# Patient Record
Sex: Female | Born: 1985 | Race: White | Hispanic: No | Marital: Married | Smoking: Never smoker
Health system: Southern US, Community
[De-identification: ages and names within clinical notes are randomized; demographics above are authoritative.]

## PROBLEM LIST (undated history)

## (undated) DIAGNOSIS — E0590 Thyrotoxicosis, unspecified without thyrotoxic crisis or storm: Secondary | ICD-10-CM

## (undated) DIAGNOSIS — R7989 Other specified abnormal findings of blood chemistry: Secondary | ICD-10-CM

## (undated) DIAGNOSIS — R0789 Other chest pain: Secondary | ICD-10-CM

## (undated) DIAGNOSIS — E785 Hyperlipidemia, unspecified: Secondary | ICD-10-CM

## (undated) HISTORY — DX: Other chest pain: R07.89

## (undated) HISTORY — DX: Thyrotoxicosis, unspecified without thyrotoxic crisis or storm: E05.90

## (undated) HISTORY — DX: Hyperlipidemia, unspecified: E78.5

## (undated) HISTORY — DX: Other specified abnormal findings of blood chemistry: R79.89

---

## 2006-02-14 ENCOUNTER — Ambulatory Visit: Payer: Self-pay | Admitting: Cardiology

## 2006-02-25 ENCOUNTER — Ambulatory Visit: Payer: Self-pay | Admitting: Endocrinology

## 2006-03-11 ENCOUNTER — Encounter: Payer: Self-pay | Admitting: Endocrinology

## 2006-03-25 ENCOUNTER — Encounter: Payer: Self-pay | Admitting: Endocrinology

## 2006-04-25 ENCOUNTER — Encounter: Payer: Self-pay | Admitting: Endocrinology

## 2006-05-02 ENCOUNTER — Encounter: Payer: Self-pay | Admitting: Endocrinology

## 2006-06-03 ENCOUNTER — Encounter: Payer: Self-pay | Admitting: Endocrinology

## 2006-06-30 ENCOUNTER — Encounter: Payer: Self-pay | Admitting: Endocrinology

## 2006-08-11 ENCOUNTER — Encounter: Payer: Self-pay | Admitting: Endocrinology

## 2006-09-12 ENCOUNTER — Encounter: Payer: Self-pay | Admitting: Endocrinology

## 2006-11-02 ENCOUNTER — Encounter: Payer: Self-pay | Admitting: Endocrinology

## 2007-02-07 ENCOUNTER — Encounter: Payer: Self-pay | Admitting: Endocrinology

## 2007-04-22 ENCOUNTER — Encounter: Payer: Self-pay | Admitting: *Deleted

## 2007-04-22 DIAGNOSIS — E0590 Thyrotoxicosis, unspecified without thyrotoxic crisis or storm: Secondary | ICD-10-CM | POA: Insufficient documentation

## 2007-05-02 ENCOUNTER — Encounter: Payer: Self-pay | Admitting: Endocrinology

## 2007-09-11 ENCOUNTER — Encounter: Payer: Self-pay | Admitting: Endocrinology

## 2007-11-02 ENCOUNTER — Encounter: Payer: Self-pay | Admitting: Endocrinology

## 2007-12-12 ENCOUNTER — Encounter: Payer: Self-pay | Admitting: Endocrinology

## 2009-01-15 ENCOUNTER — Encounter: Payer: Self-pay | Admitting: Endocrinology

## 2009-12-24 ENCOUNTER — Encounter: Payer: Self-pay | Admitting: Endocrinology

## 2010-01-09 ENCOUNTER — Ambulatory Visit: Payer: Self-pay | Admitting: Endocrinology

## 2010-08-18 NOTE — Letter (Signed)
Summary: Consent form/[facility]  Consent form/[facility]   Imported By: Sherian Rein 01/15/2010 09:48:22  _____________________________________________________________________  External Attachment:    Type:   Image     Comment:   External Document

## 2010-08-18 NOTE — Assessment & Plan Note (Signed)
Summary: NEW ENDO/THYROID/[facility]-PCP/BC[facility]/#./CD   Vital Signs:  Patient profile:   25 year old female Height:      59.5 inches (151.13 cm) Weight:      114.38 pounds (51.99 kg) BMI:     22.80 O2 Sat:      96 % on Room air Temp:     98.0 degrees F (36.67 degrees C) oral Pulse rate:   85 / minute BP sitting:   104 / 68  (left arm) Cuff size:   regular  Vitals Entered By: Brenton Grills MA (January 09, 2010 2:40 PM)  O2 Flow:  Room air CC: new endo pt/hyperthyroidism/pt states she is no longer taking propylthiouraacil/aj   Referring Provider:  howard  CC:  new endo pt/hyperthyroidism/pt states she is no longer taking propylthiouraacil/aj.  History of Present Illness: pt was seen here in 2007, after hyperthyroidism was dx'ed during a pregnancy.  she was rx'ed with ptu, and the pregnancy was uneventful.  she  moved to [place].  her hyperthyroidism improved, and she went off the ptu a few months postpartum.  she has been off the ptu since then, and tft have been normal.   pt has no sxs now.  Current Medications (verified): 1)  Propylthiouracil 50 Mg  Tabs (Propylthiouracil) .... Take 2 By Mouth Two Times A Day Qd  Allergies (verified): No Known Drug Allergies  Past History:  Past Medical History: Last updated: 04/22/2007 Hyperthyroidism  Family History: Reviewed history and no changes required. no thyroid dz  Social History: Reviewed history and no changes required. married does not work outside the home. 1 child (2008).  Review of Systems       The patient complains of headaches.  The patient denies weight loss and weight gain.         denies hoarseness, double vision, palpitations, sob, diarrhea, polyuria, myalgias, excessive diaphoresis, numbness, tremor, anxiety, bruising, and rhinorrhea.  she has regular menses.   Physical Exam  General:  normal appearance.   Head:  head: no deformity eyes: no periorbital swelling, no proptosis external nose and ears are  normal mouth: no lesion seen Neck:  thyroid is slightly and diffusely enlarged Lungs:  Clear to auscultation bilaterally. Normal respiratory effort.  Heart:  Regular rate and rhythm without murmurs or gallops noted. Normal S1,S2.   Msk:  muscle bulk and strength are grossly normal.  no obvious joint swelling.  gait is normal and steady  Extremities:  no deformity no edema Neurologic:  cn 2-12 grossly intact.   readily moves all 4's.   sensation is intact to touch on the feet  Skin:  normal texture and temp.  no rash.  not diaphoretic  Cervical Nodes:  No significant adenopathy.  Psych:  Alert and cooperative; normal mood and affect; normal attention span and concentration.   Additional Exam:  outside test results are reviewed:  12/24/09: t4=13.2 fti=3.2 tsh=1.8   Impression & Recommendations:  Problem # 1:  HYPERTHYROIDISM (ICD-242.90) resolved.  however, this pt is at high risk for recurrence of her hyperthyroidism, or for hypothyroidism  Problem # 2:  headache mild.  not related to #1  Medications Added to Medication List This Visit: 1)  Oral Contraceptive  2)  Sprintec 28 0.25-35 Mg-mcg Tabs (Norgestimate-eth estradiol) .... As dir  Other Orders: Consultation Level III [phone])  Patient Instructions: 1)  you should have your thyroid blood test checked at least once a year, or if a pregnancy should happen.  please return here if your thyroid  blood test becomes abnormal. 2)  with time, your thyroid will probably become high or low.

## 2010-08-18 NOTE — Letter (Signed)
Summary: Consult/Randall Riche MD  Consult/Randall Riche MD   Imported By: Sherian Rein 01/15/2010 09:52:05  _____________________________________________________________________  External Attachment:    Type:   Image     Comment:   External Document

## 2010-08-31 ENCOUNTER — Other Ambulatory Visit: Payer: Self-pay

## 2010-08-31 ENCOUNTER — Telehealth: Payer: Self-pay | Admitting: Endocrinology

## 2010-09-01 ENCOUNTER — Ambulatory Visit: Payer: Self-pay | Admitting: Endocrinology

## 2010-09-09 NOTE — Progress Notes (Signed)
Summary: LABS  Phone Note Call from Patient   Caller: Patient Call For: Minus Breeding MD Summary of Call: Per pt :  She is having thyroid problem  and she has appt tomorrow morning w/Dr Everardo All.  Pt want to know if she need lab work done before tomorrow so the results will be in.  Pt ph#---[phone]) Initial call taken by: Ivar Bury,  August 31, 2010 9:21 AM  Follow-up for Phone Call        tsh 242.9 Follow-up by: Minus Breeding MD,  August 31, 2010 10:41 AM  Additional Follow-up for Phone Call Additional follow up Details #1::        lab order placed into Epic.  Left detailed message for pt that she could come in today for thyroid blood test and to callback office with any questions/concerns Additional Follow-up by: Brenton Grills CMA Duncan Dull),  August 31, 2010 11:32 AM

## 2010-12-04 NOTE — Consult Note (Signed)
[facility]                            ENDOCRINOLOGY CONSULTATION   Erica Soto, Erica Soto             MRN:          [id number]  DATE:02/25/2006                            DOB:          [birth year]    REFERRING PHYSICIAN:  Dr. Dimas Aguas.   REASON FOR REFERRAL:  Hyperthyroidism.   HISTORY OF PRESENT ILLNESS:  This is a 25 year old woman who is now [redacted] weeks  pregnant. She was seen recently by Dr. Dimas Aguas complaining of several weeks  of palpitations and slight associated hair loss on the head.   PAST MEDICAL HISTORY:  Otherwise healthy.  She takes a prenatal vitamin.  Her husband states that her thyroid function studies were normal several  years ago.   SOCIAL HISTORY:  She is married and comes in today with her husband.  Significantly, they are moving to [place] on February 26, 2006, and they will  both be working there.   FAMILY HISTORY:  Negative for thyroid disease.   REVIEW OF SYSTEMS:  Denies the following:  Change in her weight, fever,  excessive diaphoresis, syncope, shortness of breath, nausea, vomiting,  anxiety, and depression.   PHYSICAL EXAMINATION:  VITALS:  Blood pressure 130/81.  Heart rate 103.  Temperature 98.9.  Weight 140.  GENERAL:  No distress.  SKIN:  Minimally diaphoretic.  HEENT:  No ptosis.  No periorbital swelling.  NECK:  They thyroid is 2 times the normal size in the left lobe, and about 1-  1/2 times normal size on the right.  CHEST:  Clear to auscultation.  No respiratory distress.  CARDIOVASCULAR:  No JVD.  No edema.  Tachycardic, regular rhythm, no murmur.  MUSCULOSKELETAL:  Gait is observed in the office to be normal.  Muscle bulk  and strength are normal.  NEUROLOGIC:  Alert, well-oriented, does not appear anxious nor depressed.  There is a minimal postural tremor present.   LABORATORY STUDIES:  Forwarded by Dr. Dimas Aguas.  On February 11, 2006, TSH 0.025.  Free thyroxine index normal at 3.7.   IMPRESSION:  1. Hyperthyroidism of uncertain etiology.  Grave's disease is most common.  2. I agree with Dr. Dimas Aguas that her palpitations and tachycardia are in      all likelihood due to the hyperthyroidism.  3. Nine weeks pregnancy.   PLAN:  1. We had a discussion about the natural history of Grave's disease and I      have told her I am not sure that that is in fact her diagnosis, but I      told her this is most likely.  I have given her a brochure about      hyperthyroidism and we have talked about the natural history of Grave's      disease.  2. I have given her a prescription for PTU 200 mg twice a day.  I have      told her of a rare side effect of PTU and she must discontinue this and      seek medical attention immediately if she develops fever on this.  3. I told her she must see a new physician, preferably a specialist, in  three weeks.  She will need to start arranging this as soon as she      arrives in [place].  I made a copy of the laboratory results from Dr.      Dimas Aguas and gave this to the patient.  In general, I do not duplicate      records from other physician's offices for patients while they are      here, but this is a bit of an unusual situation in that she is moving      to [place], and I think it is important that the new physician in      [place] have a copy of her labs.  4. I have told her if she is back in [place], I am happy to see her      then.                                   Sean A. Everardo All, MD   SAE/MedQ  DD:  02/27/2006  DT:  02/27/2006  Job #:  [id number]   cc:   [name]

## 2013-04-24 ENCOUNTER — Other Ambulatory Visit: Payer: Self-pay

## 2013-06-13 ENCOUNTER — Encounter (HOSPITAL_COMMUNITY): Payer: Self-pay | Admitting: Unknown Physician Specialty

## 2013-07-06 ENCOUNTER — Other Ambulatory Visit (HOSPITAL_COMMUNITY): Payer: Self-pay | Admitting: Unknown Physician Specialty

## 2013-07-06 DIAGNOSIS — O352XX1 Maternal care for (suspected) hereditary disease in fetus, fetus 1: Secondary | ICD-10-CM

## 2013-07-10 ENCOUNTER — Ambulatory Visit (HOSPITAL_COMMUNITY)
Admission: RE | Admit: 2013-07-10 | Discharge: 2013-07-10 | Disposition: A | Discharge status: Home / Self Care | Payer: BC Managed Care – PPO | Source: Ambulatory Visit | Attending: Unknown Physician Specialty | Admitting: Unknown Physician Specialty

## 2013-07-10 ENCOUNTER — Encounter (HOSPITAL_COMMUNITY): Payer: Self-pay

## 2013-07-10 DIAGNOSIS — IMO0002 Reserved for concepts with insufficient information to code with codable children: Secondary | ICD-10-CM | POA: Insufficient documentation

## 2013-07-10 DIAGNOSIS — O352XX1 Maternal care for (suspected) hereditary disease in fetus, fetus 1: Secondary | ICD-10-CM

## 2013-07-10 IMAGING — US US OB DETAIL+14 WK
1 series · 12 of 28 positions shown · non-contrast
Comparison: none

[Series 1: us ob detail+14 wk · 0.21mm/px · 12 of 77 slices shown]
[im 3/77]
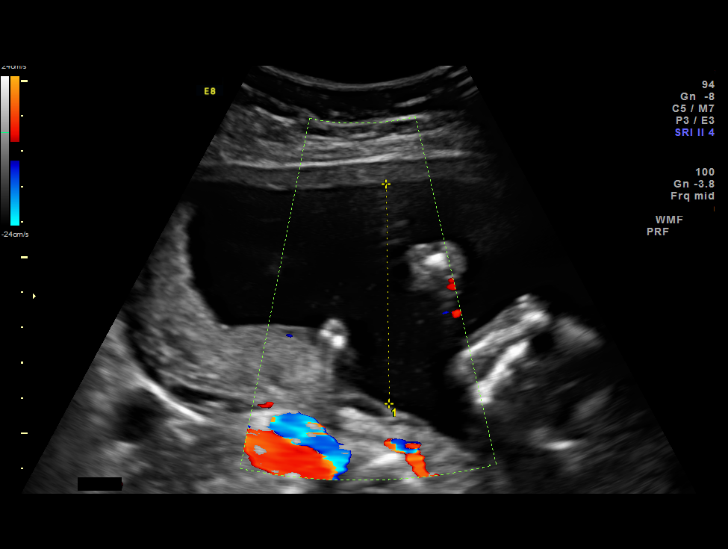
[im 9/77]
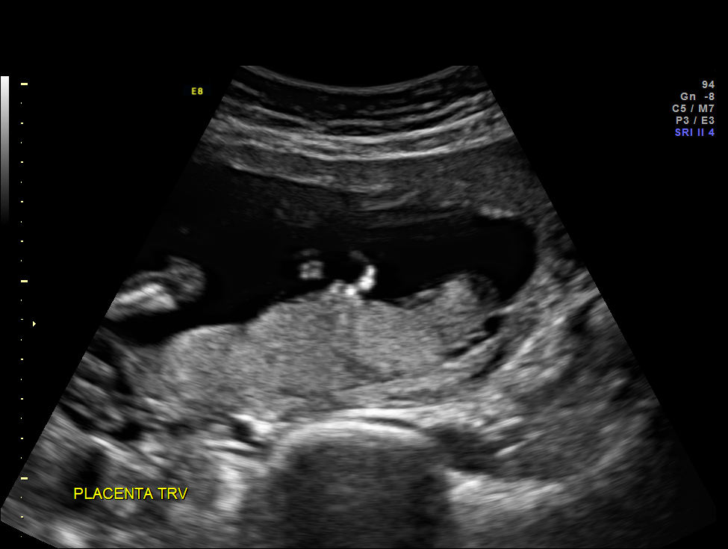
[im 15/77]
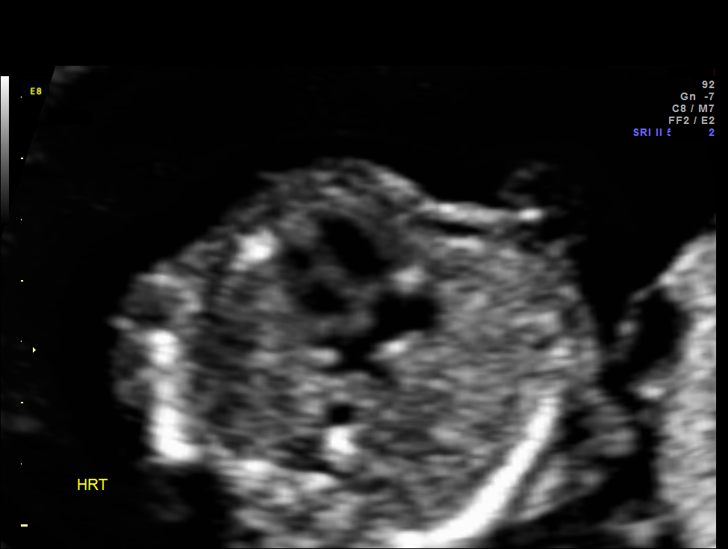
[im 23/77]
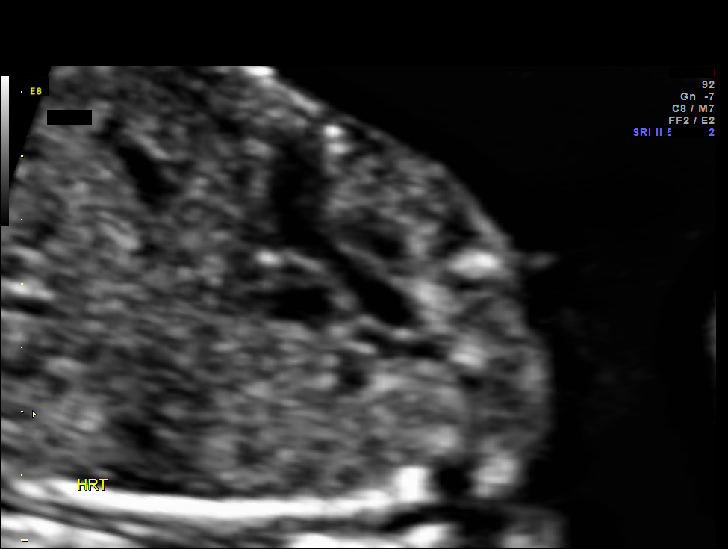
[im 29/77]
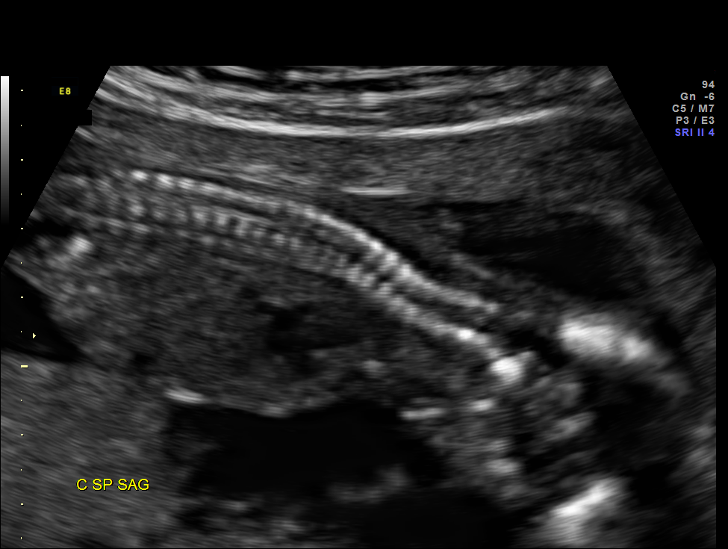
[im 34/77]
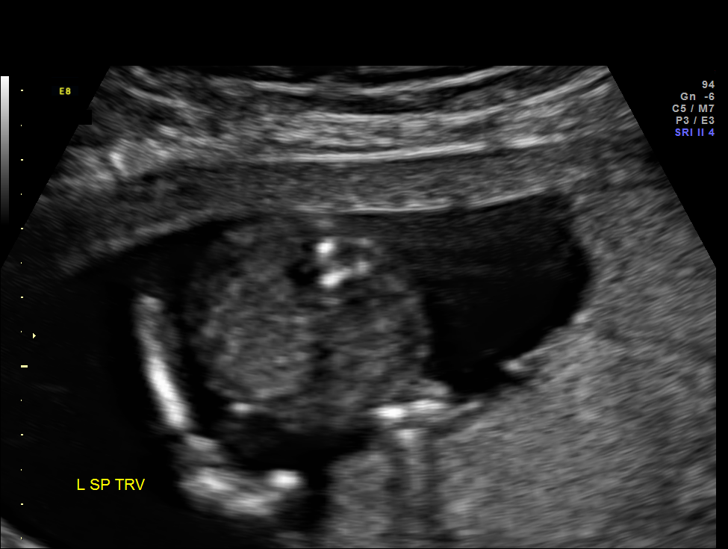
[im 43/77]
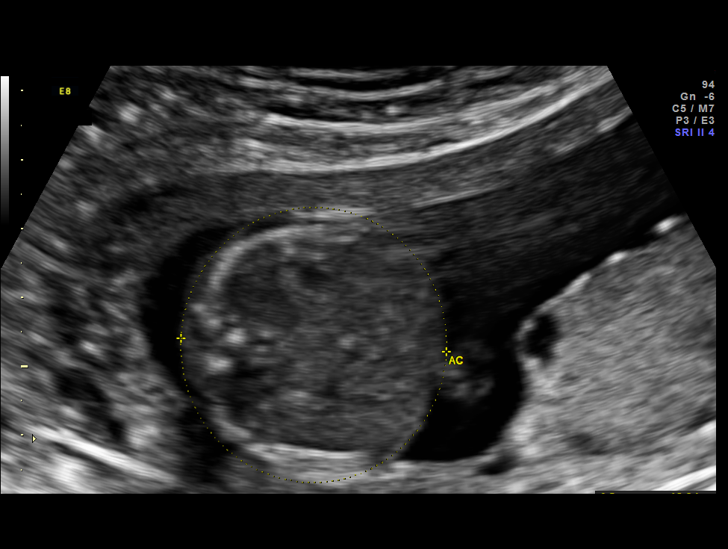
[im 48/77]
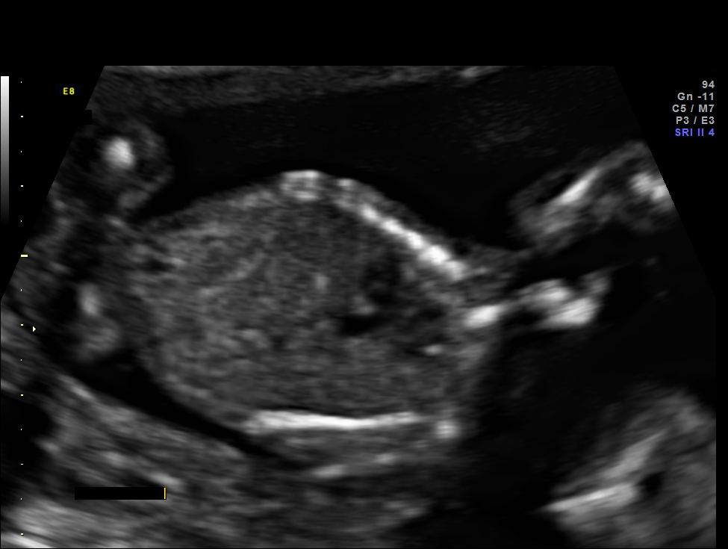
[im 54/77]
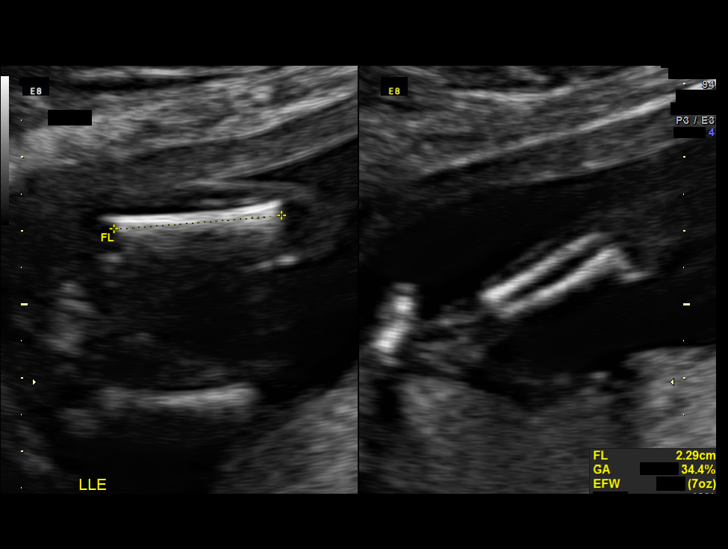
[im 62/77]
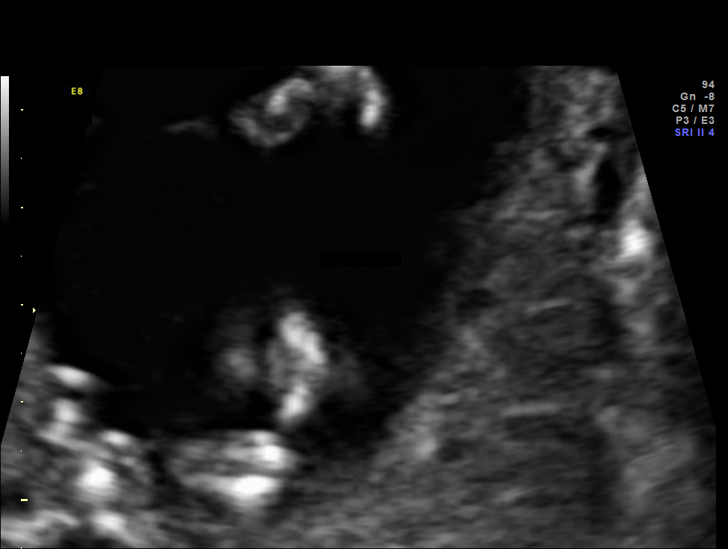
[im 68/77]
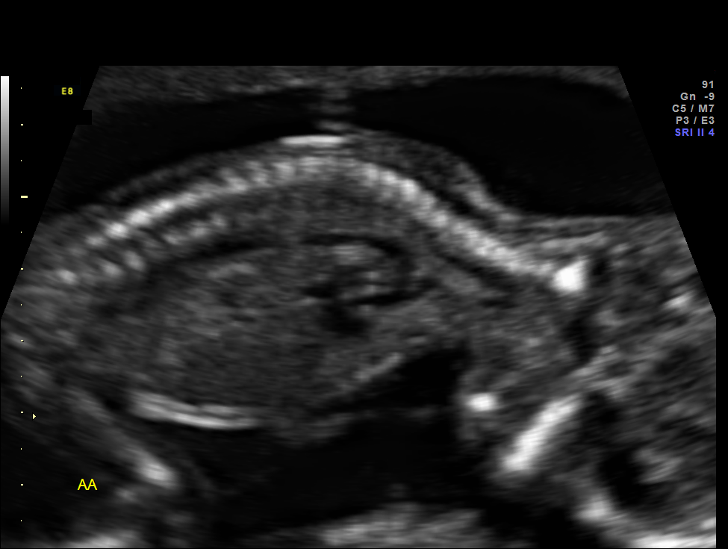
[im 74/77]
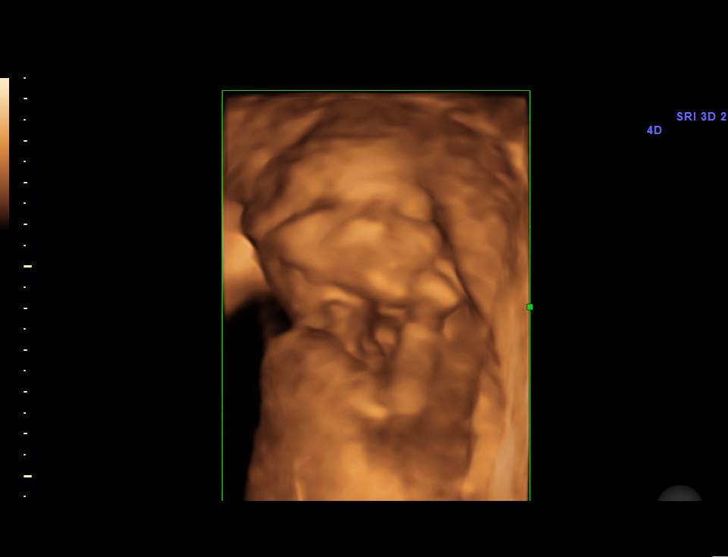

[12 of 28 positions shown; findings below may reference images not displayed]

OBSTETRICS REPORT
                      (Signed Final [DATE] [DATE])

Service(s) Provided

 US OB DETAIL + 14 WK                                  76811.0
Indications

 Detailed fetal anatomic survey                        655.83 [XJ]
 History of genetic / anatomic abnormality -
 coarctation of Aorta (prev child)
 Poor obstetric history: Previous gestational          [XJ]
 diabetes
Fetal Evaluation

 Num Of Fetuses:    1
 Fetal Heart Rate:  144                          bpm
 Cardiac Activity:  Observed
 Presentation:      Cephalic
 Placenta:          Posterior fundal, above
                    cervical os
 P. Cord            Visualized, central
 Insertion:

 Amniotic Fluid
 AFI FV:      Subjectively within normal limits
                                             Larg Pckt:     6.2  cm
Biometry

 BPD:     36.5  mm     G. Age:  17w 1d                CI:         77.2   70 - 86
 OFD:     47.3  mm                                    FL/HC:      17.0   14.6 -

 HC:     135.1  mm     G. Age:  17w 0d       33  %    HC/AC:      1.10   1.07 -

 AC:     122.6  mm     G. Age:  17w 6d       75  %    FL/BPD:
 FL:        23  mm     G. Age:  16w 6d       37  %    FL/AC:      18.8   20 - 24
 HUM:     22.8  mm     G. Age:  17w 0d       54  %
 CER:     16.7  mm     G. Age:  16w 5d       40  %

 Est. FW:     193  gm      0 lb 7 oz     60  %
Gestational Age

 U/S Today:     17w 2d                                        EDD:   [DATE]
 Best:          17w 1d     Det. By:  Early Ultrasound         EDD:   [DATE]
                                     ([DATE])
Anatomy

 Cranium:          Appears normal         Aortic Arch:      Appears normal
 Fetal Cavum:      Appears normal         Ductal Arch:      Not well visualized
 Ventricles:       Appears normal         Diaphragm:        Appears normal
 Choroid Plexus:   Appears normal         Stomach:          Appears normal
 Cerebellum:       Appears normal         Abdomen:          Appears normal
 Posterior Fossa:  Appears normal         Abdominal Wall:   Appears nml (cord
                                                            insert, abd wall)
 Nuchal Fold:      Appears normal         Cord Vessels:     Appears normal (3
                                                            vessel cord)
 Face:             Appears normal         Kidneys:          Appear normal
                   (orbits and profile)
 Lips:             Appears normal         Bladder:          Appears normal
 Heart:            Appears normal         Spine:            Appears normal
                   (4CH, axis, and
                   situs)
 RVOT:             Not well visualized    Lower             Appears normal
                                          Extremities:
 LVOT:             Appears normal         Upper             Appears normal
                                          Extremities:

 Other:  Fetus appears to be a female. Heels and 5th digit visualized. Nasal
         bone visualized.
Targeted Anatomy

 Fetal Central Nervous System
 Cisterna Magna:
Cervix Uterus Adnexa

 Cervical Length:    3.8      cm

 Cervix:       Normal appearance by transabdominal scan.

 Left Ovary:    Within normal limits.
 Right Ovary:   Within normal limits.
 Adnexa:     No abnormality visualized.
Impression

 Single IUP at 17 [DATE] weeks
 Hx of previous child with coarcation of the aorta
 Normal fetal anatomic survey
 Somewhat limited views of the fetal heart obtained due to
 early gestational age (ductus, RVOT not well visualized)
 The aortic arch appears normal
 Normal amniotic fluid volume
Recommendations

 Will scheduled formal fetal echo
 Follow-up ultrasounds as clinically indicated.

## 2013-07-10 NOTE — Progress Notes (Signed)
Genetic Counseling  High-Risk Gestation Note  Appointment Date:  07/10/2013 Referred By: Ruthy Dick, MD Date of Birth:  12/13/1985    Pregnancy History: G3P2 Estimated Date of Delivery: 12/17/13 Estimated Gestational Age: [redacted]w[redacted]d Attending: Alpha Gula, MD   I met with Mrs. Erica Soto for genetic counseling because of a previous son with coarctation of the aorta.  We began by reviewing the family history in detail. The patient reported that the second child for her husband and herself, a son, was diagnosed with coarctation of the aorta after birth. He has had two corrective surgeries at [facility] She reported that he is 53 months old and is otherwise healthy. He sees the pediatric cardiologist from [facility] every 6 months. The patient also reported a niece of her husband's (his brother's daughter) has two small holes in her heart which have not required surgical correction. She is reportedly otherwise healthy.   Mrs. Erica Soto was counseled that congenital heart defects occur at a frequency of approximately 1 in 100 births, when minor cases are included in the calculation of disease incidence. We discussed that congenital heart defects can be genetic, environmental, or multifactorial in etiology. Congenital heart defects are prominent in chromosome conditions, particularly autosomal trisomies, Turner syndrome, and microdeletions (22q11 deletion syndrome), with approximately 15-20% of congenital heart defects resulting from an underlying chromosome condition. In addition, single gene conditions are also a common genetic cause of congenital heart defects. To date, more than 500 single gene disorders are known to have congenital heart disease as a described feature.   Environmental causes for congenital heart defects are well described and many teratogens are known to be linked to congenital heart disease  (alcohol, specific medications, Rubella). The majority of the remainder of cases (>70%) are classified as multifactorial, referring to the condition being caused by a combination of both environmental and genetic causes. Given that the remainder of the couple's son's medical history is noncontributory, we discussed that it is most likely that his coarctation of the aorta is nonsyndromic. We reviewed multifactorial inheritance and that literature supports that the risk of recurrence for congenital heart disease for full siblings of an individual with coarctation of the aorta to be approximately 2%. If the relatives reported in the family history have a specific genetic condition that is currently undiagnosed, the risk of recurrence depends on the inheritance of the condition.   We reviewed the availability of a nuchal translucency ultrasound in the first trimester, a detailed ultrasound, and fetal echocardiogram. We reviewed that fetuses with an increased nuchal translucency measurement have a higher risk of congenital heart disease, thus this ultrasound measurement provides early screening for congenital heart defects. Mrs. Erica Soto reported that nuchal translucency ultrasound was previously performed through her OB office and was normal. Detailed ultrasound was performed at the time of today's visit. Visualized fetal anatomy appeared normal. Complete ultrasound results reported separately. Mrs. Erica Soto also elected to proceed with fetal echocardiogram, which was scheduled. She understands that prenatal ultrasound cannot diagnose or rule out all birth defects, including all forms of congenital heart disease.   Additionally, the father of the pregnancy has a nephew (his sister's son) with autism. This individual is 50 years old and was described to have severe autism. His mother died from a drug overdose and the patient reported that there was possible drug exposure during his pregnancy. We discussed that autism is  part of the spectrum of conditions referred  to as Autistic spectrum disorders (ASD). We discussed that ASDs are among the most common neurodevelopmental disorders, with approximately 1 in 88 children meeting criteria for ASD. Approximately 80% of individuals diagnosed are female. There is strong evidence that genetic factors play a critical role in development of ASD. There have been recent advances in identifying specific genetic causes of ASD, however, there are still many individuals for whom the etiology of the ASD is not known. Once a family has a child with a diagnosis of ASD, there is a 13.5% chance to have another child with ASD. We discussed that data are limited regarding recurrence risk estimate for extended relatives. However, given the reported family history, recurrence risk for the couple's offspring would likely be similar to the general population risk, in the case of multifactorial inheritance. She understands that at this time there is not genetic testing available for ASD for most families.   The family histories were otherwise found to be noncontributory for birth defects, mental retardation, and known genetic conditions. Without further information regarding the provided family history, an accurate genetic risk cannot be calculated. Further genetic counseling is warranted if more information is obtained.  Mrs. Erica Soto was provided with written information regarding cystic fibrosis (CF) including the carrier frequency and incidence in the Caucasian population, the availability of carrier testing and prenatal diagnosis if indicated.  In addition, we discussed that CF is routinely screened for as part of the [place] newborn screening panel.  She declined testing today.   Mrs. Erica Soto denied exposure to environmental toxins or chemical agents. She denied the use of alcohol, tobacco or street drugs. She denied significant viral illnesses during the course of her pregnancy. Her medical and surgical  histories were noncontributory.   I counseled Mrs. Erica Soto regarding the above risks and available options.  The approximate face-to-face time with the genetic counselor was 30 minutes.  Quinn Plowman, MS Certified Genetic Counselor 07/10/2013

## 2013-07-10 NOTE — Progress Notes (Signed)
Erica Soto  was seen today for an ultrasound appointment.  See full report in [facility].  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   Alpha Gula, MD

## 2013-07-10 NOTE — Addendum Note (Signed)
Encounter addended by: Alessandra Bevels. Chase Picket, RN on: 07/10/2013  3:19 PM<BR>     Documentation filed: Episodes, Chief Complaint Section

## 2013-07-17 ENCOUNTER — Inpatient Hospital Stay (HOSPITAL_COMMUNITY)
Admission: RE | Admit: 2013-07-17 | Discharge: 2013-07-17 | Disposition: A | Discharge status: Home / Self Care | Payer: Self-pay | Source: Ambulatory Visit

## 2013-07-17 ENCOUNTER — Encounter (HOSPITAL_COMMUNITY): Payer: Self-pay

## 2013-08-03 ENCOUNTER — Encounter: Payer: Self-pay | Admitting: Unknown Physician Specialty

## 2013-08-06 ENCOUNTER — Other Ambulatory Visit (HOSPITAL_COMMUNITY): Payer: Self-pay | Admitting: Maternal and Fetal Medicine

## 2013-08-06 DIAGNOSIS — O352XX0 Maternal care for (suspected) hereditary disease in fetus, not applicable or unspecified: Secondary | ICD-10-CM

## 2013-08-08 ENCOUNTER — Ambulatory Visit (HOSPITAL_COMMUNITY): Payer: BC Managed Care – PPO

## 2014-05-15 ENCOUNTER — Encounter (HOSPITAL_COMMUNITY): Payer: Self-pay | Admitting: *Deleted

## 2014-05-20 ENCOUNTER — Encounter (HOSPITAL_COMMUNITY): Payer: Self-pay | Admitting: *Deleted

## 2019-03-19 NOTE — Progress Notes (Signed)
Cardiology Office Note   Date:  03/21/2019   ID:  Wells, Gerdeman 1985/11/03, MRN [id number]  PCP:  Rory Percy, MD  Cardiologist:   Minus Breeding, MD Referring:  Rory Percy, MD  Chief Complaint  Patient presents with  . Chest Pain      History of Present Illness: Erica Soto is a 33 y.o. female who is referred by Rory Percy, MD for evaluation of chest discomfort.  She has had no past cardiac history.  About 10 days ago she developed discomfort under her chest.  It was a mid chest pressure.  It would come and go away by itself.  However, by Thursday of last week it was 24 hours in duration constant.  It seems to be worse with emotional stress.  It was 6 out of 10 in intensity.  She really did not do anything after that and so did not notice any exertional component.  She did go to see her primary provider and I reviewed all of these records.  She had a very slightly elevated d-dimer.  However, CT was unremarkable except for questionable mild reflux of contrast into the inferior vena cava.  He seemed to have a kidney stone in the left upper kidney.  Troponin was negative.  Chest x-ray was unremarkable.  EKG time she presented to her primary office was normal.  He said that since then the substernal component has gone away although she has a little soreness in her left shoulder.  She said no nausea or vomiting.  There is no association with food.  She has had no change in bowel habit.  She has had no new shortness of breath, PND or orthopnea.  He said no weight gain or edema.  Her blood pressure has been elevated.   Past Medical History:  Diagnosis Date  . Chest pressure   . Elevated d-dimer   . Hyperlipidemia   . Hyperthyroidism     No past surgical history on file.   Current Outpatient Medications  Medication Sig Dispense Refill  . Acetaminophen (TYLENOL PO) Take by mouth as needed.     . Turmeric (QC TUMERIC COMPLEX PO) Take by mouth. Take 2 tablets by mouth once  daily    . spironolactone (ALDACTONE) 25 MG tablet Take 1 tablet (25 mg total) by mouth daily. 90 tablet 3   No current facility-administered medications for this visit.     Allergies:   Patient has no known allergies.    Social History:  The patient  reports that she has never smoked. She has never used smokeless tobacco.   Family History:  The patient's family history includes Heart Problems in her mother; Hypertension in her brother, father, mother, sister, and another family member.    ROS:  Please see the history of present illness.   Otherwise, review of systems are positive for none.   All other systems are reviewed and negative.    PHYSICAL EXAM: VS:  BP (!) 160/98   Pulse (!) 113   Ht 4' 11.5" (1.511 m)   Wt 141 lb (64 kg)   BMI 28.00 kg/m  , BMI Body mass index is 28 kg/m. GENERAL:  Frail appearing HEENT:  Pupils equal round and reactive, fundi not visualized, oral mucosa unremarkable NECK:  No jugular venous distention, waveform within normal limits, carotid upstroke brisk and symmetric, no bruits, no thyromegaly LYMPHATICS:  No cervical, inguinal adenopathy LUNGS:  Clear to auscultation bilaterally BACK:  No CVA tenderness CHEST:  Unremarkable HEART:  PMI not displaced or sustained,S1 and S2 within normal limits, no S3, no S4, no clicks, no rubs, 2 out of 6 apical brief systolic murmur heard at the apex and right upper sternal border, no diastolic murmurs ABD:  Flat, positive bowel sounds normal in frequency in pitch, no bruits, no rebound, no guarding, no midline pulsatile mass, no hepatomegaly, no splenomegaly EXT:  2 plus pulses throughout, no edema, no cyanosis no clubbing SKIN:  No rashes no nodules NEURO:  Cranial nerves II through XII grossly intact, motor grossly intact throughout PSYCH:  Cognitively intact, oriented to person place and time    EKG:  EKG is not ordered today. The ekg ordered last week demonstrates sinus rhythm, rate 95, axis within normal  limits, intervals within normal limits, no acute ST-T wave changes.   Recent Labs: No results found for requested labs within last 8760 hours.    Lipid Panel No results found for: CHOL, TRIG, HDL, CHOLHDL, VLDL, LDLCALC, LDLDIRECT    Wt Readings from Last 3 Encounters:  03/21/19 141 lb (64 kg)  07/10/13 144 lb (65.3 kg)  02/25/06 140 lb (63.5 kg)      Other studies Reviewed: Additional studies/ records that were reviewed today include: Labs, CT results and office records. Review of the above records demonstrates:  Please see elsewhere in the note.     ASSESSMENT AND PLAN:  CHEST PAIN: Her chest pain is very atypical for an anginal pain.  Just probability of obstructive coronary disease is very low.  There was no objective evidence of ischemia including a normal troponin and EKG.  I do not think further coronary artery testing is indicated.  There was no evidence of pulmonary embolism or dissection.  Is a very strong possibility that this is a GI etiology.  I will check an echocardiogram to see if there is any evidence of pericarditis.  It seems to be improved currently.  I suggested trying some Prilosec.  I would consider stress testing in the future pending these results.  MURMUR: I will check an echocardiogram as above.  HTN: The blood pressure is very elevated.  I am going to start spironolactone and check a basic metabolic profile about 10 days.  She might ultimately need a beta-blocker instead.  She is can get a blood pressure cuff and keep a diary.  I will see her back in a couple of weeks.  Of note she has a strong family history of early hypertension.  I will consider work-up of secondary causes in the future.  Of note her labs including her renal function and potassium are fine.   Current medicines are reviewed at length with the patient today.  The patient does not have concerns regarding medicines.  The following changes have been made:  no change  Labs/ tests ordered  today include:   Orders Placed This Encounter  Procedures  . Basic metabolic panel  . ECHOCARDIOGRAM COMPLETE     Disposition:   FU with me in one month.     Signed, Rollene RotundaJames Jaymere Alen, MD  03/21/2019 3:15 PM    [facility]

## 2019-03-21 ENCOUNTER — Telehealth: Payer: Self-pay | Admitting: Cardiology

## 2019-03-21 ENCOUNTER — Encounter: Payer: Self-pay | Admitting: Cardiology

## 2019-03-21 ENCOUNTER — Ambulatory Visit: Payer: BC Managed Care – PPO | Admitting: Cardiology

## 2019-03-21 ENCOUNTER — Other Ambulatory Visit: Payer: Self-pay

## 2019-03-21 VITALS — BP 160/98 | HR 113 | Ht 59.5 in | Wt 141.0 lb

## 2019-03-21 DIAGNOSIS — Z79899 Other long term (current) drug therapy: Secondary | ICD-10-CM

## 2019-03-21 DIAGNOSIS — I10 Essential (primary) hypertension: Secondary | ICD-10-CM | POA: Diagnosis not present

## 2019-03-21 DIAGNOSIS — R0789 Other chest pain: Secondary | ICD-10-CM | POA: Diagnosis not present

## 2019-03-21 DIAGNOSIS — R079 Chest pain, unspecified: Secondary | ICD-10-CM

## 2019-03-21 MED ORDER — SPIRONOLACTONE 25 MG PO TABS: 25.0000 mg | ORAL_TABLET | Freq: Every day | ORAL | 3 refills | Status: DC

## 2019-03-21 NOTE — Telephone Encounter (Signed)
  Patient is asking if she should start her new blood pressure medication tonight or can she wait until the morning tomorrow. Her bp at her visit today was 160/98. Please advise

## 2019-03-21 NOTE — Telephone Encounter (Signed)
She can start tonight.

## 2019-03-21 NOTE — Telephone Encounter (Signed)
Returned call to pt she states that her BP is high and was wondering if she could take her medicine tonight to bring her BP down

## 2019-03-21 NOTE — Telephone Encounter (Signed)
Pt informed of providers result & recommendations. Pt verbalized understanding. No further questions . ? ?

## 2019-03-21 NOTE — Patient Instructions (Addendum)
Medication Instructions:  Please start Spironolactone 25 mg once a day. Continue all other medications as listed.  If you need a refill on your cardiac medications before your next appointment, please call your pharmacy.   Lab work: Please have blood work in 10 days (BMP) with results faxed to [phone] Attn: Dr Percival Spanish. If you have labs (blood work) drawn today and your tests are completely normal, you will receive your results only by: Marland Kitchen MyChart Message (if you have MyChart) OR . A paper copy in the mail If you have any lab test that is abnormal or we need to change your treatment, we will call you to review the results.  Testing/Procedures: Your physician has requested that you have an echocardiogram. Echocardiography is a painless test that uses sound waves to create images of your heart. It provides your doctor with information about the size and shape of your heart and how well your heart's chambers and valves are working. This procedure takes approximately one hour. There are no restrictions for this procedure. Report to main entrance at [facility] at 2:45 pm.  Please wear a mask.  There are no other instructions for this testing.  If you need to reschedule please call [phone].  Follow-Up: Follow up as scheduled.  Thank you for choosing [facility]!!

## 2019-03-22 ENCOUNTER — Ambulatory Visit (HOSPITAL_COMMUNITY)
Admission: RE | Admit: 2019-03-22 | Discharge: 2019-03-22 | Disposition: A | Discharge status: Home / Self Care | Payer: BC Managed Care – PPO | Source: Ambulatory Visit | Attending: Cardiology | Admitting: Cardiology

## 2019-03-22 ENCOUNTER — Other Ambulatory Visit: Payer: Self-pay

## 2019-03-22 DIAGNOSIS — R0789 Other chest pain: Secondary | ICD-10-CM | POA: Diagnosis present

## 2019-03-22 DIAGNOSIS — R079 Chest pain, unspecified: Secondary | ICD-10-CM

## 2019-03-22 DIAGNOSIS — I10 Essential (primary) hypertension: Secondary | ICD-10-CM | POA: Diagnosis present

## 2019-03-22 NOTE — Progress Notes (Signed)
*  PRELIMINARY RESULTS* Echocardiogram 2D Echocardiogram has been performed.  Leavy Cella 03/22/2019, 3:49 PM

## 2019-03-23 ENCOUNTER — Telehealth: Payer: Self-pay | Admitting: Cardiology

## 2019-03-23 NOTE — Telephone Encounter (Signed)
Routed to MD & primary. Pending result note

## 2019-03-23 NOTE — Telephone Encounter (Signed)
Patient was calling to get the results of her echo done at [facility] on 03/22/19. You can call her or send her a MyChart message, whatever is easier

## 2019-03-25 NOTE — Telephone Encounter (Signed)
Results note completed. 

## 2019-03-27 DIAGNOSIS — R079 Chest pain, unspecified: Secondary | ICD-10-CM

## 2019-03-28 NOTE — Telephone Encounter (Signed)
Patient reviewed results in my chart

## 2019-03-29 ENCOUNTER — Other Ambulatory Visit: Payer: Self-pay

## 2019-03-29 MED ORDER — CHLORTHALIDONE 25 MG PO TABS: 25.0000 mg | ORAL_TABLET | Freq: Every day | ORAL | 3 refills | Status: AC

## 2019-03-29 NOTE — Telephone Encounter (Signed)
Pt advised labs next week prior to her appt is fine per Dr. Percival Spanish and I will add a fasting lipid panel.

## 2019-03-29 NOTE — Telephone Encounter (Signed)
Pt verbalized understanding to change to Chlorthalidone... she is due to have labs prior to her follow up with Dr. Percival Spanish ... [email or web address] which was planned after starting the spironolactone but prior to her 04/04/19 appt with Dr. Percival Spanish.   Will check with Dr. Percival Spanish to see if we can cancel next weeks labs and plan for 2 weeks as per his new order and if we can add Lipids per her request.  Labs will be drawn close to her home with her PCP.. [facility] in [place].

## 2019-03-30 ENCOUNTER — Telehealth: Payer: Self-pay | Admitting: Cardiology

## 2019-03-30 NOTE — Telephone Encounter (Signed)
Spoke with patient and discussed answered questions regarding medication

## 2019-03-30 NOTE — Telephone Encounter (Signed)
New message     Pt c/o medication issue:  1. Name of Medication: chlorthalidone  2. How are you currently taking this medication (dosage and times per day)? 25mg   3. Are you having a reaction (difficulty breathing--STAT)?  4. What is your medication issue? Patients has lots of questions about medication

## 2019-04-02 DIAGNOSIS — R011 Cardiac murmur, unspecified: Secondary | ICD-10-CM | POA: Insufficient documentation

## 2019-04-02 DIAGNOSIS — R079 Chest pain, unspecified: Secondary | ICD-10-CM | POA: Insufficient documentation

## 2019-04-02 DIAGNOSIS — I10 Essential (primary) hypertension: Secondary | ICD-10-CM | POA: Insufficient documentation

## 2019-04-02 NOTE — Progress Notes (Signed)
Cardiology Office Note   Date:  04/04/2019   ID:  Erica Soto, DOB Mar 28, 1986, MRN [id number]  PCP:  Selinda FlavinHoward, Kevin, MD  Cardiologist:   Rollene RotundaJames Axiel Fjeld, MD Referring:  Selinda FlavinHoward, Kevin, MD  Chief Complaint  Patient presents with  . Chest Pain      History of Present Illness: Erica Soto is a 33 y.o. female who is referred by Selinda FlavinHoward, Kevin, MD for evaluation of chest discomfort.  I saw her recently for this.  This sounded atypical for obstructive CAD.  She had a murmur and an echo which was essentially normal.   She was very hypertensive at the initial visit.  I started spironolactone but she did not want to be careful about her potassium intake and so I changed her to chlorthalidone.     Since I last saw her she has done well.   The patient denies any new symptoms such as chest discomfort, neck or arm discomfort. There has been no new shortness of breath, PND or orthopnea. There have been no reported palpitations, presyncope or syncope.  She is eating better and walking.  Past Medical History:  Diagnosis Date  . Chest pressure   . Elevated d-dimer   . Hyperlipidemia   . Hyperthyroidism     History reviewed. No pertinent surgical history.   Current Outpatient Medications  Medication Sig Dispense Refill  . Acetaminophen (TYLENOL PO) Take by mouth as needed.     . chlorthalidone (HYGROTON) 25 MG tablet Take 1 tablet (25 mg total) by mouth daily. 90 tablet 3  . FISH OIL-BORAGE-FLAX-SAFFLOWER PO Take by mouth daily.    . Magnesium 250 MG TABS Take by mouth daily.    . Turmeric (QC TUMERIC COMPLEX PO) Take by mouth. Take 2 tablets by mouth once daily     No current facility-administered medications for this visit.     Allergies:   Patient has no known allergies.    ROS:  Please see the history of present illness.   Otherwise, review of systems are positive for none.   All other systems are reviewed and negative.    PHYSICAL EXAM: VS:  BP (!) 146/98   Pulse 96   Ht 4'  11.5" (1.511 m)   Wt 137 lb (62.1 kg)   BMI 27.21 kg/m  , BMI Body mass index is 27.21 kg/m. GENERAL:  Well appearing NECK:  No jugular venous distention, waveform within normal limits, carotid upstroke brisk and symmetric, no bruits, no thyromegaly LUNGS:  Clear to auscultation bilaterally CHEST:  Unremarkable HEART:  PMI not displaced or sustained,S1 and S2 within normal limits, no S3, no S4, no clicks, no rubs, no murmurs ABD:  Flat, positive bowel sounds normal in frequency in pitch, no bruits, no rebound, no guarding, no midline pulsatile mass, no hepatomegaly, no splenomegaly EXT:  2 plus pulses throughout, no edema, no cyanosis no clubbing   EKG:  EKG is not not ordered today.    Recent Labs: No results found for requested labs within last 8760 hours.    Lipid Panel No results found for: CHOL, TRIG, HDL, CHOLHDL, VLDL, LDLCALC, LDLDIRECT    Wt Readings from Last 3 Encounters:  04/04/19 137 lb (62.1 kg)  03/21/19 141 lb (64 kg)  07/10/13 144 lb (65.3 kg)      Other studies Reviewed: Additional studies/ records that were reviewed today include: None Review of the above records demonstrates:  NA   ASSESSMENT AND PLAN:  CHEST PAIN:  The patient's not describing chest discomfort any longer.  No further work-up is suggested.  I did review the work-up previously done.  This is thought possibly to be GI and she will get this explored if she has recurrent symptoms.    MURMUR:  She had a normal echo.  No further work-up.  This is likely flow murmur.  HTN: The blood pressure is still elevated and diastolic.  She is going to give this a few more weeks to work and send pressures in my chart.  I might need to add another agent.    Current medicines are reviewed at length with the patient today.  The patient does not have concerns regarding medicines.  The following changes have been made:   None  Labs/ tests ordered today include: None  No orders of the defined types  were placed in this encounter.    Disposition:   FU with me as needed based on future BP readings or symptoms.    Signed, Minus Breeding, MD  04/04/2019 10:06 AM    [facility]

## 2019-04-04 ENCOUNTER — Ambulatory Visit: Payer: BC Managed Care – PPO | Admitting: Cardiology

## 2019-04-04 ENCOUNTER — Other Ambulatory Visit: Payer: Self-pay

## 2019-04-04 ENCOUNTER — Encounter: Payer: Self-pay | Admitting: Cardiology

## 2019-04-04 VITALS — BP 146/98 | HR 96 | Ht 59.5 in | Wt 137.0 lb

## 2019-04-04 DIAGNOSIS — R0789 Other chest pain: Secondary | ICD-10-CM | POA: Diagnosis not present

## 2019-04-04 DIAGNOSIS — R079 Chest pain, unspecified: Secondary | ICD-10-CM

## 2019-04-04 DIAGNOSIS — I10 Essential (primary) hypertension: Secondary | ICD-10-CM

## 2019-04-04 DIAGNOSIS — R011 Cardiac murmur, unspecified: Secondary | ICD-10-CM | POA: Diagnosis not present

## 2019-04-04 NOTE — Patient Instructions (Signed)
Medication Instructions:  The current medical regimen is effective;  continue present plan and medications.  Please have your most recent blood work faxed to Dr Percival Spanish at [phone].  Follow-Up: Follow up as needed with Dr Percival Spanish.  Thank you for choosing [facility]!!
